# Patient Record
Sex: Female | Born: 1990 | Race: Black or African American | Hispanic: No | Marital: Single | Smoking: Never smoker
Health system: Southern US, Community
[De-identification: ages and names within clinical notes are randomized; demographics above are authoritative.]

## PROBLEM LIST (undated history)

## (undated) DIAGNOSIS — F410 Panic disorder [episodic paroxysmal anxiety] without agoraphobia: Secondary | ICD-10-CM

---
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
## 2013-01-06 ENCOUNTER — Encounter (HOSPITAL_COMMUNITY): Payer: Self-pay | Admitting: *Deleted

## 2013-01-06 ENCOUNTER — Emergency Department (HOSPITAL_COMMUNITY)
Admission: EM | Admit: 2013-01-06 | Discharge: 2013-01-06 | Disposition: A | Discharge status: Home / Self Care | Payer: BC Managed Care – PPO | Attending: Emergency Medicine | Admitting: Emergency Medicine

## 2013-01-06 DIAGNOSIS — F410 Panic disorder [episodic paroxysmal anxiety] without agoraphobia: Secondary | ICD-10-CM | POA: Insufficient documentation

## 2013-01-06 DIAGNOSIS — Z3202 Encounter for pregnancy test, result negative: Secondary | ICD-10-CM | POA: Insufficient documentation

## 2013-01-06 DIAGNOSIS — Z79899 Other long term (current) drug therapy: Secondary | ICD-10-CM | POA: Insufficient documentation

## 2013-01-06 HISTORY — DX: Panic disorder (episodic paroxysmal anxiety): F41.0

## 2013-01-06 LAB — CBC WITH DIFFERENTIAL/PLATELET
Basophils Absolute: 0 10*3/uL (ref 0.0–0.1)
Lymphocytes Relative: 19 % (ref 12–46)
Lymphs Abs: 1.7 10*3/uL (ref 0.7–4.0)
MCV: 89.3 fL (ref 78.0–100.0)
Neutro Abs: 6.7 10*3/uL (ref 1.7–7.7)
Platelets: 225 10*3/uL (ref 150–400)
RBC: 3.73 MIL/uL — ABNORMAL LOW (ref 3.87–5.11)
RDW: 12.3 % (ref 11.5–15.5)
WBC: 9 10*3/uL (ref 4.0–10.5)

## 2013-01-06 LAB — POCT PREGNANCY, URINE: Preg Test, Ur: NEGATIVE

## 2013-01-06 LAB — BASIC METABOLIC PANEL
CO2: 25 mEq/L (ref 19–32)
Calcium: 8.7 mg/dL (ref 8.4–10.5)
Chloride: 103 mEq/L (ref 96–112)
Creatinine, Ser: 0.79 mg/dL (ref 0.50–1.10)
GFR calc Af Amer: >90 mL/min (ref >90)
Sodium: 135 mEq/L (ref 135–145)

## 2013-01-06 LAB — URINE MICROSCOPIC-ADD ON

## 2013-01-06 LAB — URINALYSIS, ROUTINE W REFLEX MICROSCOPIC
Glucose, UA: NEGATIVE mg/dL
Nitrite: NEGATIVE
Specific Gravity, Urine: 1.029 (ref 1.005–1.030)
pH: 5.5 (ref 5.0–8.0)

## 2013-01-06 MED ORDER — ASPIRIN 81 MG PO CHEW: 162.0000 mg | CHEWABLE_TABLET | Freq: Once | ORAL | Status: DC

## 2013-01-06 MED ORDER — ALPRAZOLAM 0.5 MG PO TABS: 0.5000 mg | ORAL_TABLET | Freq: Two times a day (BID) | ORAL | Status: DC | PRN

## 2013-01-06 NOTE — ED Notes (Signed)
Pt comes in complaining of epigastric discomfort and nausea. sts that the pain made her anxious and she had a panic attack. Pt calm upon talking to her. Denies vomiting or diarrhea. Hx of anxiety

## 2013-01-06 NOTE — ED Notes (Addendum)
Pt states she is having a panic attack.  States she cannot breath and lightheaded, which is normal for panic attacks.  Sats of 100%.  Pt states she has been feeling "strange" lately.  Her stomach is "crampy".

## 2013-01-06 NOTE — ED Provider Notes (Signed)
History     CSN: [id number]  Arrival date & time 01/06/13  1257   First MD Initiated Contact with Patient 01/06/13 1411      Chief Complaint  Patient presents with  . Panic Attack    (Consider location/radiation/quality/duration/timing/severity/associated sxs/prior treatment) HPI Comments: Pt with no medical hx, no family hx of premature CAD comes in worth cc of panic attacks. States that she has been having some anxiety related problems for the past 2 years. She has not see a doctor for it. She states that without any provokation, about once a week, she will have anxiety  - where she becomes sick to her stomach, starts feeling like she has some dib. She has taken no meds. Today, she had an episode at work, worse than usual, and so she came to the ED. No hx of DVT, PE, no chest pain, no risk factors for acs, PE.   The history is provided by the patient.    Past Medical History  Diagnosis Date  . Panic attacks     History reviewed. No pertinent past surgical history.  No family history on file.  History  Substance Use Topics  . Smoking status: Never Smoker   . Smokeless tobacco: Not on file  . Alcohol Use: Yes     Comment: occasionally    OB History   Grav Para Term Preterm Abortions TAB SAB Ect Mult Living                  Review of Systems  Constitutional: Negative for activity change.  HENT: Negative for neck pain.   Respiratory: Negative for shortness of breath.   Cardiovascular: Negative for chest pain.  Gastrointestinal: Negative for nausea, vomiting and abdominal pain.  Genitourinary: Negative for dysuria.  Neurological: Negative for headaches.  Psychiatric/Behavioral: The patient is nervous/anxious.     Allergies  Review of patient's allergies indicates no known allergies.  Home Medications   Current Outpatient Rx  Name  Route  Sig  Dispense  Refill  . MELATONIN PO   Oral   Take 1 capsule by mouth at bedtime as needed (for sleep).         .  ALPRAZolam (XANAX) 0.5 MG tablet   Oral   Take 1 tablet (0.5 mg total) by mouth 2 (two) times daily as needed for anxiety.   10 tablet   0     BP 126/80  Pulse 84  Temp(Src) 98.8 F (37.1 C) (Oral)  Resp 14  SpO2 100%  LMP 12/06/2012  Physical Exam  Constitutional: She is oriented to person, place, and time. She appears well-developed and well-nourished.  HENT:  Head: Normocephalic and atraumatic.  Eyes: EOM are normal. Pupils are equal, round, and reactive to light.  Neck: Neck supple.  Cardiovascular: Normal rate, regular rhythm and normal heart sounds.   No murmur heard. Pulmonary/Chest: Effort normal. No respiratory distress.  Abdominal: Soft. She exhibits no distension. There is no tenderness. There is no rebound and no guarding.  Neurological: She is alert and oriented to person, place, and time.  Skin: Skin is warm and dry.    ED Course  Procedures (including critical care time)  Labs Reviewed  CBC WITH DIFFERENTIAL - Abnormal; Notable for the following:    RBC 3.73 (*)    Hemoglobin 11.5 (*)    HCT 33.3 (*)    All other components within normal limits  URINALYSIS, ROUTINE W REFLEX MICROSCOPIC - Abnormal; Notable for the following:  APPearance CLOUDY (*)    Hgb urine dipstick TRACE (*)    Leukocytes, UA SMALL (*)    All other components within normal limits  URINE MICROSCOPIC-ADD ON - Abnormal; Notable for the following:    Squamous Epithelial / LPF MANY (*)    Bacteria, UA MANY (*)    All other components within normal limits  BASIC METABOLIC PANEL  POCT PREGNANCY, URINE   No results found.   1. Panic anxiety syndrome       MDM   Date: 01/06/2013  Rate: 73  Rhythm: normal sinus rhythm  QRS Axis: normal  Intervals: normal  ST/T Wave abnormalities: normal  Conduction Disutrbances: none  Narrative Interpretation: unremarkable   Pt with hx of anxiety comes in with cc of panic attacks. She is feeling a lot better when i saw her. We will get  screenign EKG and basic labs only. She will get prn anxiety meds, i asked her to see a pcp and community resources were provided for that.      Derwood Kaplan, MD 01/06/13 1728

## 2014-01-06 ENCOUNTER — Emergency Department: Payer: Self-pay | Admitting: Internal Medicine

## 2015-05-28 ENCOUNTER — Emergency Department
Admission: EM | Admit: 2015-05-28 | Discharge: 2015-05-28 | Disposition: A | Discharge status: Home / Self Care | Payer: Self-pay | Attending: Emergency Medicine | Admitting: Emergency Medicine

## 2015-05-28 ENCOUNTER — Encounter: Payer: Self-pay | Admitting: *Deleted

## 2015-05-28 DIAGNOSIS — B9689 Other specified bacterial agents as the cause of diseases classified elsewhere: Secondary | ICD-10-CM

## 2015-05-28 DIAGNOSIS — Z3202 Encounter for pregnancy test, result negative: Secondary | ICD-10-CM | POA: Insufficient documentation

## 2015-05-28 DIAGNOSIS — N760 Acute vaginitis: Secondary | ICD-10-CM | POA: Insufficient documentation

## 2015-05-28 LAB — CHLAMYDIA/NGC RT PCR (ARMC ONLY)
Chlamydia Tr: NOT DETECTED
N gonorrhoeae: NOT DETECTED

## 2015-05-28 LAB — WET PREP, GENITAL
TRICH WET PREP: NONE SEEN
YEAST WET PREP: NONE SEEN

## 2015-05-28 LAB — POCT PREGNANCY, URINE: PREG TEST UR: NEGATIVE

## 2015-05-28 MED ORDER — METRONIDAZOLE 500 MG PO TABS: 500.0000 mg | ORAL_TABLET | Freq: Two times a day (BID) | ORAL | Status: DC

## 2015-05-28 NOTE — ED Notes (Signed)
Assessed per PA 

## 2015-05-28 NOTE — ED Provider Notes (Signed)
[facility] Emergency Department Provider Note ____________________________________________  Time seen: Approximately 1:19 PM  I have reviewed the triage vital signs and the nursing notes.   HISTORY  Chief Complaint Vaginal Bleeding   HPI Adrienne Trujillo is a 24 y.o. female who presents to the emergency Department for spotting after intercourse for the past several months. She denies elbow pain or abdominal pain. She denies pain with intercourse.   Past Medical History  Diagnosis Date  . Panic attacks     There are no active problems to display for this patient.   History reviewed. No pertinent past surgical history.  Current Outpatient Rx  Name  Route  Sig  Dispense  Refill  . ALPRAZolam (XANAX) 0.5 MG tablet   Oral   Take 1 tablet (0.5 mg total) by mouth 2 (two) times daily as needed for anxiety.   10 tablet   0   . MELATONIN PO   Oral   Take 1 capsule by mouth at bedtime as needed (for sleep).         . metroNIDAZOLE (FLAGYL) 500 MG tablet   Oral   Take 1 tablet (500 mg total) by mouth 2 (two) times daily. Avoid alcohol while taking this medication.   14 tablet   0     Allergies Review of patient's allergies indicates no known allergies.  No family history on file.  Social History Social History  Substance Use Topics  . Smoking status: Never Smoker   . Smokeless tobacco: None  . Alcohol Use: Yes     Comment: occasionally    Review of Systems Constitutional: No fever/chills Cardiovascular: Denies chest pain. Respiratory: Denies shortness of breath or cough. Gastrointestinal: Abdominal pain no., nausea no, vomitingno. Genitourinary: Dysuria no, vaginal discharge yes.. Musculoskeletal: Negative for back pain. Skin: Negative for rash. Neurological: Negative for headaches, focal weakness or numbness.  10-point ROS otherwise negative.  ____________________________________________   PHYSICAL EXAM:  VITAL SIGNS: ED  Triage Vitals  Enc Vitals Group     BP 05/28/15 1259 108/83 mmHg     Pulse Rate 05/28/15 1259 74     Resp 05/28/15 1259 18     Temp 05/28/15 1259 98.3 F (36.8 C)     Temp Source 05/28/15 1259 Oral     SpO2 05/28/15 1259 100 %     Weight 05/28/15 1259 133 lb (60.328 kg)     Height 05/28/15 1259  (1.753 m)     Head Cir --      Peak Flow --      Pain Score --      Pain Loc --      Pain Edu? --      Excl. in GC? --     Constitutional: Alert and oriented. Well appearing and in no acute distress. Eyes: Conjunctivae are normal. PERRL. EOMI. Head: Atraumatic. Nose: No congestion/rhinnorhea. Mouth/Throat: Mucous membranes are moist.  Oropharynx non-erythematous. Neck: No stridor. Cardiovascular: Good peripheral circulation. Respiratory: Normal respiratory effort.  No retractions. Gastrointestinal: Soft and nontender. No distention. No abdominal bruits. Genitourinary: Pelvic exam: Malodorous discharge present, external exam normal, then discharge noted in the vaginal vault and on the cervix. There is no cervical motion tenderness. There is no adnexal tenderness. Musculoskeletal: No extremity tenderness nor edema.  Neurologic:  Normal speech and language. No gross focal neurologic deficits are appreciated. Speech is normal. No gait instability. Skin:  Skin is warm, dry and intact. No rash noted. Psychiatric: Mood and affect are  normal. Speech and behavior are normal.  ____________________________________________   LABS (all labs ordered are listed, but only abnormal results are displayed)  Labs Reviewed  WET PREP, GENITAL - Abnormal; Notable for the following:    Clue Cells Wet Prep HPF POC FEW (*)    WBC, Wet Prep HPF POC MODERATE (*)    All other components within normal limits  CHLAMYDIA/NGC RT PCR ([facility] ONLY)  POC URINE PREG, ED  POCT PREGNANCY, URINE  POC URINE PREG, ED    ____________________________________________  RADIOLOGY   ____________________________________________   PROCEDURES  Procedure(s) performed: Pelvic exam see assessment  ____________________________________________   INITIAL IMPRESSION / ASSESSMENT AND PLAN / ED COURSE  Pertinent labs & imaging results that were available during my care of the patient were reviewed by me and considered in my medical decision making (see chart for details).  -----------------------------------------  Patient was advised to follow up with the primary care provider for symptoms that are not improving over the next week. She was advised to return to the emergency department for symptoms that change or worsen if unable to schedule an appointment with the primary care provider or specialist.  FINAL CLINICAL IMPRESSION(S) / ED DIAGNOSES  Final diagnoses:  Bacterial vaginal infection      Chinita Pester, FNP 05/28/15 1429  Loleta Rose, MD 05/28/15 1544

## 2015-05-28 NOTE — ED Notes (Signed)
Pt reports vaginal bleeding pink in color only after intercourse, pt denies abdominal pain

## 2015-05-28 NOTE — Discharge Instructions (Signed)
Bacterial Vaginosis Bacterial vaginosis is a vaginal infection that occurs when the normal balance of bacteria in the vagina is disrupted. It results from an overgrowth of certain bacteria. This is the most common vaginal infection in women of childbearing age. Treatment is important to prevent complications, especially in pregnant women, as it can cause a premature delivery. CAUSES  Bacterial vaginosis is caused by an increase in harmful bacteria that are normally present in smaller amounts in the vagina. Several different kinds of bacteria can cause bacterial vaginosis. However, the reason that the condition develops is not fully understood. RISK FACTORS Certain activities or behaviors can put you at an increased risk of developing bacterial vaginosis, including:  Having a new sex partner or multiple sex partners.  Douching.  Using an intrauterine device (IUD) for contraception. Women do not get bacterial vaginosis from toilet seats, bedding, swimming pools, or contact with objects around them. SIGNS AND SYMPTOMS  Some women with bacterial vaginosis have no signs or symptoms. Common symptoms include:  Grey vaginal discharge.  A fishlike odor with discharge, especially after sexual intercourse.  Itching or burning of the vagina and vulva.  Burning or pain with urination. DIAGNOSIS  Your health care provider will take a medical history and examine the vagina for signs of bacterial vaginosis. A sample of vaginal fluid may be taken. Your health care provider will look at this sample under a microscope to check for bacteria and abnormal cells. A vaginal pH test may also be done.  TREATMENT  Bacterial vaginosis may be treated with antibiotic medicines. These may be given in the form of a pill or a vaginal cream. A second round of antibiotics may be prescribed if the condition comes back after treatment.  HOME CARE INSTRUCTIONS   Only take over-the-counter or prescription medicines as  directed by your health care provider.  If antibiotic medicine was prescribed, take it as directed. Make sure you finish it even if you start to feel better.  Do not have sex until treatment is completed.  Tell all sexual partners that you have a vaginal infection. They should see their health care provider and be treated if they have problems, such as a mild rash or itching.  Practice safe sex by using condoms and only having one sex partner. SEEK MEDICAL CARE IF:   Your symptoms are not improving after 3 days of treatment.  You have increased discharge or pain.  You have a fever. MAKE SURE YOU:   Understand these instructions.  Will watch your condition.  Will get help right away if you are not doing well or get worse. FOR MORE INFORMATION  Centers for Disease Control and Prevention, Division of STD Prevention: www.cdc.gov/std American Sexual Health Association (ASHA): www.ashastd.org  Document Released: 09/29/2005 Document Revised: 07/20/2013 Document Reviewed: 05/11/2013 ExitCare Patient Information 2015 [employer]. This information is not intended to replace advice given to you by your health care provider. Make sure you discuss any questions you have with your health care provider.  

## 2015-06-11 ENCOUNTER — Telehealth: Payer: Self-pay | Admitting: Emergency Medicine

## 2015-06-11 NOTE — ED Notes (Signed)
Pt had called me about std tests.  i gave her results. She says she is feeling better.

## 2015-06-11 NOTE — ED Notes (Signed)
Patient left message on my voicemail asking for results from couple weeks ago.  Called her back today and left message.

## 2016-02-09 ENCOUNTER — Emergency Department
Admission: EM | Admit: 2016-02-09 | Discharge: 2016-02-09 | Disposition: A | Discharge status: Home / Self Care | Payer: BLUE CROSS/BLUE SHIELD | Attending: Emergency Medicine | Admitting: Emergency Medicine

## 2016-02-09 ENCOUNTER — Encounter: Payer: Self-pay | Admitting: Emergency Medicine

## 2016-02-09 ENCOUNTER — Emergency Department: Payer: BLUE CROSS/BLUE SHIELD

## 2016-02-09 DIAGNOSIS — R0789 Other chest pain: Secondary | ICD-10-CM | POA: Diagnosis present

## 2016-02-09 DIAGNOSIS — F410 Panic disorder [episodic paroxysmal anxiety] without agoraphobia: Secondary | ICD-10-CM | POA: Diagnosis not present

## 2016-02-09 DIAGNOSIS — R079 Chest pain, unspecified: Secondary | ICD-10-CM

## 2016-02-09 LAB — BASIC METABOLIC PANEL
ANION GAP: 9 (ref 5–15)
BUN: 15 mg/dL (ref 6–20)
CALCIUM: 9.1 mg/dL (ref 8.9–10.3)
CO2: 23 mmol/L (ref 22–32)
Chloride: 109 mmol/L (ref 101–111)
Creatinine, Ser: 1.03 mg/dL — ABNORMAL HIGH (ref 0.44–1.00)
GFR calc non Af Amer: >60 mL/min (ref >60)
GLUCOSE: 82 mg/dL (ref 65–99)
Potassium: 3.5 mmol/L (ref 3.5–5.1)
Sodium: 141 mmol/L (ref 135–145)

## 2016-02-09 LAB — CBC
HEMATOCRIT: 36.2 % (ref 35.0–47.0)
HEMOGLOBIN: 12.4 g/dL (ref 12.0–16.0)
MCH: 31.1 pg (ref 26.0–34.0)
MCHC: 34.3 g/dL (ref 32.0–36.0)
MCV: 90.9 fL (ref 80.0–100.0)
Platelets: 231 10*3/uL (ref 150–440)
RBC: 3.98 MIL/uL (ref 3.80–5.20)
RDW: 13.3 % (ref 11.5–14.5)
WBC: 6.7 10*3/uL (ref 3.6–11.0)

## 2016-02-09 LAB — TROPONIN I: Troponin I: <0.03 ng/mL (ref <0.031)

## 2016-02-09 NOTE — ED Notes (Addendum)
Pt states she has anxiety and has been "dealing with it for years now." Pt has been to [facility] a few times to see if seeing a therapist would help. Pt states she has anxiety often but chest pains began yesterday. 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 Pt states "I don't know, I think I just have a lot going on. It's just bad anxiety. It keeps me from sleeping sometimes." When asked what triggers the pt's anxiety, the pt responded: "the smallest things" for example pt said "I can just look at a person and go crazy." Denies SI or HI.

## 2016-02-09 NOTE — ED Provider Notes (Signed)
[facility] Emergency Department Provider Note  ____________________________________________  Time seen: Approximately 630AM  I have reviewed the triage vital signs and the nursing notes.   HISTORY  Chief Complaint Anxiety and Chest Pain    HPI Adrienne Trujillo is a 25 y.o. female with a history of panic attacks was presenting to the emergency Department with 2 days of worsening panic attacks associated with chest pain. She says that the chest pain is across her chest and feels like a pressure and cramping type pain. She denies any pain at this time. She says that she has been increasingly stressed about certain family issues. She is denying any homicidal or suicidal ideation. Does not take any medication for anxiety. Says that she has had chest pain in the past with her panic attacks. Denies any radiation of the pain. Denies any shortness of breath, nausea, vomiting or diaphoresis with the pain. Denies any hormone supplementation such as birth control. Denies any family members dying suddenly at young ages from cardiac causes.   Past Medical History  Diagnosis Date  . Panic attacks     There are no active problems to display for this patient.   History reviewed. No pertinent past surgical history.  No current outpatient prescriptions on file.  Allergies Amoxicillin  No family history on file.  Social History Social History  Substance Use Topics  . Smoking status: Never Smoker   . Smokeless tobacco: None  . Alcohol Use: Yes     Comment: occasionally    Review of Systems Constitutional: No fever/chills Eyes: No visual changes. ENT: No sore throat. Cardiovascular: As above Respiratory: Denies shortness of breath. Gastrointestinal: No abdominal pain.  No nausea, no vomiting.  No diarrhea.  No constipation. Genitourinary: Negative for dysuria. Musculoskeletal: Negative for back pain. Skin: Negative for rash. Neurological: Negative for focal  weakness or numbness.  10-point ROS otherwise negative.  ____________________________________________   PHYSICAL EXAM:  VITAL SIGNS: ED Triage Vitals  Enc Vitals Group     BP 02/09/16 0453 151/74 mmHg     Pulse Rate 02/09/16 0453 87     Resp 02/09/16 0453 18     Temp 02/09/16 0453 98.4 F (36.9 C)     Temp Source 02/09/16 0453 Oral     SpO2 02/09/16 0453 100 %     Weight 02/09/16 0453 125 lb (56.7 kg)     Height 02/09/16 0453 5\' 9"  (1.753 m)     Head Cir --      Peak Flow --      Pain Score 02/09/16 0454 2     Pain Loc --      Pain Edu? --      Excl. in GC? --     Constitutional: Alert and oriented. Well appearing and in no acute distress. Eyes: Conjunctivae are normal. PERRL. EOMI. Head: Atraumatic. Nose: No congestion/rhinnorhea. Mouth/Throat: Mucous membranes are moist.   Neck: No stridor.   Cardiovascular: Normal rate, regular rhythm. Grossly normal heart sounds.   Respiratory: Normal respiratory effort.  No retractions. Lungs CTAB. Gastrointestinal: Soft and nontender. No distention. No abdominal bruits.  Musculoskeletal: No lower extremity tenderness nor edema.  No joint effusions. Neurologic:  Normal speech and language. No gross focal neurologic deficits are appreciated. No gait instability. Skin:  Skin is warm, dry and intact. No rash noted. Psychiatric: Mood and affect are normal. Speech and behavior are normal.  ____________________________________________   LABS (all labs ordered are listed, but only abnormal results are  displayed)  Labs Reviewed  BASIC METABOLIC PANEL - Abnormal; Notable for the following:    Creatinine, Ser 1.03 (*)    All other components within normal limits  CBC  TROPONIN I   ____________________________________________  EKG  ED ECG REPORT I, Darah Simkin,  Teena Irani, the attending physician, personally viewed and interpreted this ECG.   Date: 02/09/2016  EKG Time: 503  Rate: 74  Rhythm: normal sinus rhythm with sinus  arrhythmia  Axis: Normal  Intervals:Early repolarization abnormality. RSR prime pattern in V1.  ST&T Change: No ST segment elevation or depression. No abnormal T-wave inversion. No significant change from EKG done on 01/06/2013. ____________________________________________  RADIOLOGY  No acute disease on the chest x-ray. ____________________________________________   PROCEDURES   ____________________________________________   INITIAL IMPRESSION / ASSESSMENT AND PLAN / ED COURSE  Pertinent labs & imaging results that were available during my care of the patient were reviewed by me and considered in my medical decision making (see chart for details).  Pt is PERC negative.  Patient says that the symptoms only are concurrent with her feeling stressed or panic. She says that she has been Rh able had a bad experience. We will consult TTS for psych referral and to ensure the patient has options for follow-up as an outpatient. I cleansed the patient and she is understanding of this plan. ____________________________________________   FINAL CLINICAL IMPRESSION(S) / ED DIAGNOSES  Panic attack. Chest pain.    Myrna Blazer, MD 02/09/16 [phone]

## 2016-02-09 NOTE — ED Notes (Signed)
Patient tearful in triage. Patient reports that she has issues with anxiety and has [facility]. Patient reports that the anxiety has been worse the last couple of days and started developing chest pain yesterday. Patient denies SI.

## 2016-02-09 NOTE — BH Assessment (Addendum)
Assessment Note  Adrienne Trujillo is an 25 y.o. female who presents to the ER due to anxiety and panic attacks. She states she has them approximately once a week. Symptoms are; chest pains, shortness of breath, increase heart palpations, dizziness and light headed.  Her primary concern was that she wasn't having any medical problems. She states, someone at her mother's job, who was the same age as the patient, had a heart attack. Patient doesn't have a history of heart problems but due to her anxiety and chest, she came to the ER to get checked out.  She's been seen in the past, with [facility] for anxiety. However, she didn't like the services they provided and stop going. She was prescribed medication for her anxiety but she didn't take it. "I don't even know what I did with them pills."  She denies SI/HI and AV/H.   Diagnosis: Anxiety  Past Medical History:  Past Medical History  Diagnosis Date  . Panic attacks     History reviewed. No pertinent past surgical history.  Family History: No family history on file.  Social History:  reports that she has never smoked. She does not have any smokeless tobacco history on file. She reports that she drinks alcohol. She reports that she does not use illicit drugs.  Additional Social History:  Alcohol / Drug Use Pain Medications: See PTA Prescriptions: See PTA Over the Counter: See PTA History of alcohol / drug use?: No history of alcohol / drug abuse Longest period of sobriety (when/how long): No use Negative Consequences of Use:  (No use) Withdrawal Symptoms:  (No use)  CIWA: CIWA-Ar BP: (!) 151/74 mmHg Pulse Rate: 87 COWS:    Allergies:  Allergies  Allergen Reactions  . Amoxicillin Hives and Other (See Comments)    Has patient had a PCN reaction causing immediate rash, facial/tongue/throat swelling, SOB or lightheadedness with hypotension: No Has patient had a PCN reaction causing severe rash involving mucus membranes or skin necrosis:  No Has patient had a PCN reaction that required hospitalization No Has patient had a PCN reaction occurring within the last 10 years: Yes If all of the above answers are "NO", then may proceed with Cephalosporin use.    Home Medications:  (Not in a hospital admission)  OB/GYN Status:  Patient's last menstrual period was 01/12/2016 (approximate).  General Assessment Data [facility] Assessment: In system Is this a Tele or Face-to-Face Assessment?: Face-to-Face Is this an Initial Assessment or a Re-assessment for this encounter?: Initial Assessment Marital status: Single Maiden name: n/a Is patient pregnant?: No Pregnancy Status: No Living Arrangements: Parent Can pt return to current living arrangement?: Yes Admission Status: Voluntary Is patient capable of signing voluntary admission?: Yes Referral Source: Self/Family/Friend Insurance type: Scientist, research (physical sciences)BCBS  Medical Screening Exam [facility] Walk-[facility] ONLY) Medical Exam completed: Yes Reason for MSE not completed:  (n/a)  Crisis Care Plan Living Arrangements: Parent Legal Guardian: Other: Name of Psychiatrist: Reports of none Name of Therapist: Reports of none  Education Status Is patient currently in school?: No Current Grade: n/a Highest grade of school patient has completed: College Name of school: n/a Contact person: n/a  Risk to self with the past 6 months Suicidal Ideation: No Has patient been a risk to self within the past 6 months prior to admission? : Other (comment) Suicidal Intent: No Has patient had any suicidal intent within the past 6 months prior to admission? : No Is patient at risk for suicide?: No Suicidal  Plan?: No Has patient had any suicidal plan within the past 6 months prior to admission? : No Access to Means: No What has been your use of drugs/alcohol within the last 12 months?: Reports of none Previous Attempts/Gestures: No Other Self Harm Risks: Reports of none Triggers for Past  Attempts: None known Intentional Self Injurious Behavior: None Family Suicide History: No Recent stressful life event(s): Other (Comment) Persecutory voices/beliefs?: No Depression: No Depression Symptoms: Feeling angry/irritable, Isolating Substance abuse history and/or treatment for substance abuse?: No Suicide prevention information given to non-admitted patients: Not applicable  Risk to Others within the past 6 months Homicidal Ideation: No Does patient have any lifetime risk of violence toward others beyond the six months prior to admission? : No Thoughts of Harm to Others: No Current Homicidal Intent: No Current Homicidal Plan: No Access to Homicidal Means: No Identified Victim: Reports of none History of harm to others?: No Assessment of Violence: None Noted Violent Behavior Description: Reports of none Does patient have access to weapons?: No Criminal Charges Pending?: No Does patient have a court date: No Is patient on probation?: No  Psychosis Hallucinations: None noted Delusions: None noted  Mental Status Report Appearance/Hygiene: In hospital gown, In scrubs, Unremarkable Eye Contact: Good Motor Activity: Freedom of movement, Unremarkable Speech: Logical/coherent, Unremarkable Level of Consciousness: Alert Mood: Anxious, Pleasant Affect: Appropriate to circumstance Anxiety Level: Minimal Thought Processes: Coherent, Relevant Judgement: Unimpaired Orientation: Person, Place, Time, Situation, Appropriate for developmental age Obsessive Compulsive Thoughts/Behaviors: Minimal  Cognitive Functioning Concentration: Normal Memory: Recent Intact, Remote Intact IQ: Average Insight: Fair Impulse Control: Fair Appetite: Fair Weight Loss: 0 Weight Gain: 0 Sleep: No Change Total Hours of Sleep: 6 Vegetative Symptoms: None  ADLScreening [facility] Assessment Services) Patient's cognitive ability adequate to safely complete daily activities?: Yes Patient able to  express need for assistance with ADLs?: Yes Independently performs ADLs?: Yes (appropriate for developmental age)  Prior Inpatient Therapy Prior Inpatient Therapy: No Prior Therapy Dates: n/a Prior Therapy Facilty/Provider(s): n/a Reason for Treatment: n/a  Prior Outpatient Therapy Prior Outpatient Therapy: Yes Prior Therapy Dates: Unknown Prior Therapy Facilty/Provider(s): [facility] Reason for Treatment: Anxiety Does patient have an ACCT team?: No Does patient have [facility] services? : No Does patient have [facility] services?: No  ADL Screening (condition at time of admission) Patient's cognitive ability adequate to safely complete daily activities?: Yes Is the patient deaf or have difficulty hearing?: No Does the patient have difficulty seeing, even when wearing glasses/contacts?: No Does the patient have difficulty concentrating, remembering, or making decisions?: No Patient able to express need for assistance with ADLs?: Yes Does the patient have difficulty dressing or bathing?: No Independently performs ADLs?: Yes (appropriate for developmental age) Does the patient have difficulty walking or climbing stairs?: No Weakness of Legs: None Weakness of Arms/Hands: None  Home Assistive Devices/Equipment Home Assistive Devices/Equipment: None  Therapy Consults (therapy consults require a physician order) PT Evaluation Needed: No OT Evalulation Needed: No SLP Evaluation Needed: No Abuse/Neglect Assessment (Assessment to be complete while patient is alone) Physical Abuse: Denies Verbal Abuse: Denies Sexual Abuse: Denies Exploitation of patient/patient's resources: Denies Self-Neglect: Denies Values / Beliefs Cultural Requests During Hospitalization: None Spiritual Requests During Hospitalization: None Consults Spiritual Care Consult Needed: No Social Work Consult Needed: No Merchant navy officer (For Healthcare) Does patient have an advance directive?: No    Additional  Information 1:1 In Past 12 Months?: No CIRT Risk: No Elopement Risk: No Does patient have medical clearance?: Yes  Child/Adolescent Assessment 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

## 2016-02-09 NOTE — Discharge Instructions (Signed)
You were evaluated for chest discomfort, and although no certain cause was found, Dr. Langston MaskerShaevitz suspected some anxiety component.  You examine evaluation are reassuring today in the emergency department. Return to emergency department for any worsening condition clean trouble breathing, chest pain, fever, coughing, dizziness or passing out, or any palpitations. Return for any thoughts of wanting to hurt herself or others, or depression.   Nonspecific Chest Pain It is often hard to find the cause of chest pain. There is always a chance that your pain could be related to something serious, such as a heart attack or a blood clot in your lungs. Chest pain can also be caused by conditions that are not life-threatening. If you have chest pain, it is very important to follow up with your doctor.  HOME CARE  If you were prescribed an antibiotic medicine, finish it all even if you start to feel better.  Avoid any activities that cause chest pain.  Do not use any tobacco products, including cigarettes, chewing tobacco, or electronic cigarettes. If you need help quitting, ask your doctor.  Do not drink alcohol.  Take medicines only as told by your doctor.  Keep all follow-up visits as told by your doctor. This is important. This includes any further testing if your chest pain does not go away.  Your doctor may tell you to keep your head raised (elevated) while you sleep.  Make lifestyle changes as told by your doctor. These may include:  Getting regular exercise. Ask your doctor to suggest some activities that are safe for you.  Eating a heart-healthy diet. Your doctor or a diet specialist (dietitian) can help you to learn healthy eating options.  Maintaining a healthy weight.  Managing diabetes, if necessary.  Reducing stress. GET HELP IF:  Your chest pain does not go away, even after treatment.  You have a rash with blisters on your chest.  You have a fever. GET HELP RIGHT AWAY  IF:  Your chest pain is worse.  You have an increasing cough, or you cough up blood.  You have severe belly (abdominal) pain.  You feel extremely weak.  You pass out (faint).  You have chills.  You have sudden, unexplained chest discomfort.  You have sudden, unexplained discomfort in your arms, back, neck, or jaw.  You have shortness of breath at any time.  You suddenly start to sweat, or your skin gets clammy.  You feel nauseous.  You vomit.  You suddenly feel light-headed or dizzy.  Your heart begins to beat quickly, or it feels like it is skipping beats. These symptoms may be an emergency. Do not wait to see if the symptoms will go away. Get medical help right away. Call your local emergency services (911 in the [place] Do not drive yourself to the hospital.   This information is not intended to replace advice given to you by your health care provider. Make sure you discuss any questions you have with your health care provider.   Document Released: 03/17/2008 Document Revised: 10/20/2014 Document Reviewed: 05/05/2014 Elsevier Interactive Patient Education 2016 Elsevier Inc.  Panic Attacks Panic attacks are sudden, short-livedsurges of severe anxiety, fear, or discomfort. They may occur for no reason when you are relaxed, when you are anxious, or when you are sleeping. Panic attacks may occur for a number of reasons:   Healthy people occasionally have panic attacks in extreme, life-threatening situations, such as war or natural disasters. Normal anxiety is a protective mechanism of the body that  helps [place] react to danger (fight or flight response).  Panic attacks are often seen with anxiety disorders, such as panic disorder, social anxiety disorder, generalized anxiety disorder, and phobias. Anxiety disorders cause excessive or uncontrollable anxiety. They may interfere with your relationships or other life activities.  Panic attacks are sometimes seen with other mental  illnesses, such as depression and posttraumatic stress disorder.  Certain medical conditions, prescription medicines, and drugs of abuse can cause panic attacks. SYMPTOMS  Panic attacks start suddenly, peak within 20 minutes, and are accompanied by four or more of the following symptoms:  Pounding heart or fast heart rate (palpitations).  Sweating.  Trembling or shaking.  Shortness of breath or feeling smothered.  Feeling choked.  Chest pain or discomfort.  Nausea or strange feeling in your stomach.  Dizziness, light-headedness, or feeling like you will faint.  Chills or hot flushes.  Numbness or tingling in your lips or hands and feet.  Feeling that things are not real or feeling that you are not yourself.  Fear of losing control or going crazy.  Fear of dying. Some of these symptoms can mimic serious medical conditions. For example, you may think you are having a heart attack. Although panic attacks can be very scary, they are not life threatening. DIAGNOSIS  Panic attacks are diagnosed through an assessment by your health care provider. Your health care provider will ask questions about your symptoms, such as where and when they occurred. Your health care provider will also ask about your medical history and use of alcohol and drugs, including prescription medicines. Your health care provider may order blood tests or other studies to rule out a serious medical condition. Your health care provider may refer you to a mental health professional for further evaluation. TREATMENT   Most healthy people who have one or two panic attacks in an extreme, life-threatening situation will not require treatment.  The treatment for panic attacks associated with anxiety disorders or other mental illness typically involves counseling with a mental health professional, medicine, or a combination of both. Your health care provider will help determine what treatment is best for you.  Panic  attacks due to physical illness usually go away with treatment of the illness. If prescription medicine is causing panic attacks, talk with your health care provider about stopping the medicine, decreasing the dose, or substituting another medicine.  Panic attacks due to alcohol or drug abuse go away with abstinence. Some adults need professional help in order to stop drinking or using drugs. HOME CARE INSTRUCTIONS   Take all medicines as directed by your health care provider.   Schedule and attend follow-up visits as directed by your health care provider. It is important to keep all your appointments. SEEK MEDICAL CARE IF:  You are not able to take your medicines as prescribed.  Your symptoms do not improve or get worse. SEEK IMMEDIATE MEDICAL CARE IF:   You experience panic attack symptoms that are different than your usual symptoms.  You have serious thoughts about hurting yourself or others.  You are taking medicine for panic attacks and have a serious side effect. MAKE SURE YOU:  Understand these instructions.  Will watch your condition.  Will get help right away if you are not doing well or get worse.   This information is not intended to replace advice given to you by your health care provider. Make sure you discuss any questions you have with your health care provider.   Document Released: 09/29/2005  Document Revised: 10/04/2013 Document Reviewed: 05/13/2013 Elsevier Interactive Patient Education [employer]

## 2016-02-09 NOTE — ED Notes (Signed)
Dr. Schaevitz at bedside.  

## 2016-02-09 NOTE — BH Assessment (Signed)
Discussed patient with ER MD (Dr. Shaune PollackLord) and patient is able to discharge home when medically cleared. Patient was giving referral information and instructions on how to follow up with Outpatient Treatment ([facility] and [facility]) and [facility].  Writer also advised the patient to call the toll free phone on his insurance card for the counselors and agencies in his network.  Patient denies SI/HI and AV/H.

## 2016-02-09 NOTE — ED Provider Notes (Signed)
I accepted care from Dr. Langston MaskerShaevitz.  She came here for some nonspecific chest discomfort, and her evaluation was reassuring, not suggestive of emergency cardiac or pulmonary problem. I will refer her for primary care follow-up.  There is some concern for anxiety/panic attack as a contributor to the episode.  Calvin, from [facility], did evaluate the patient, and she would like outpatient referrals for behavioral medicine.    Governor Rooksebecca Laylaa Guevarra, MD 02/09/16 [phone]

## 2016-11-03 IMAGING — CR DG CHEST 2V
1 series · 2 of 2 positions shown · non-contrast
Comparison: None.

CLINICAL DATA: Chest pain

EXAM:
CHEST  2 VIEW

[Series 1: w chest pa · 0.14mm/px · 2 of 2 slices shown]
[im 1/2]
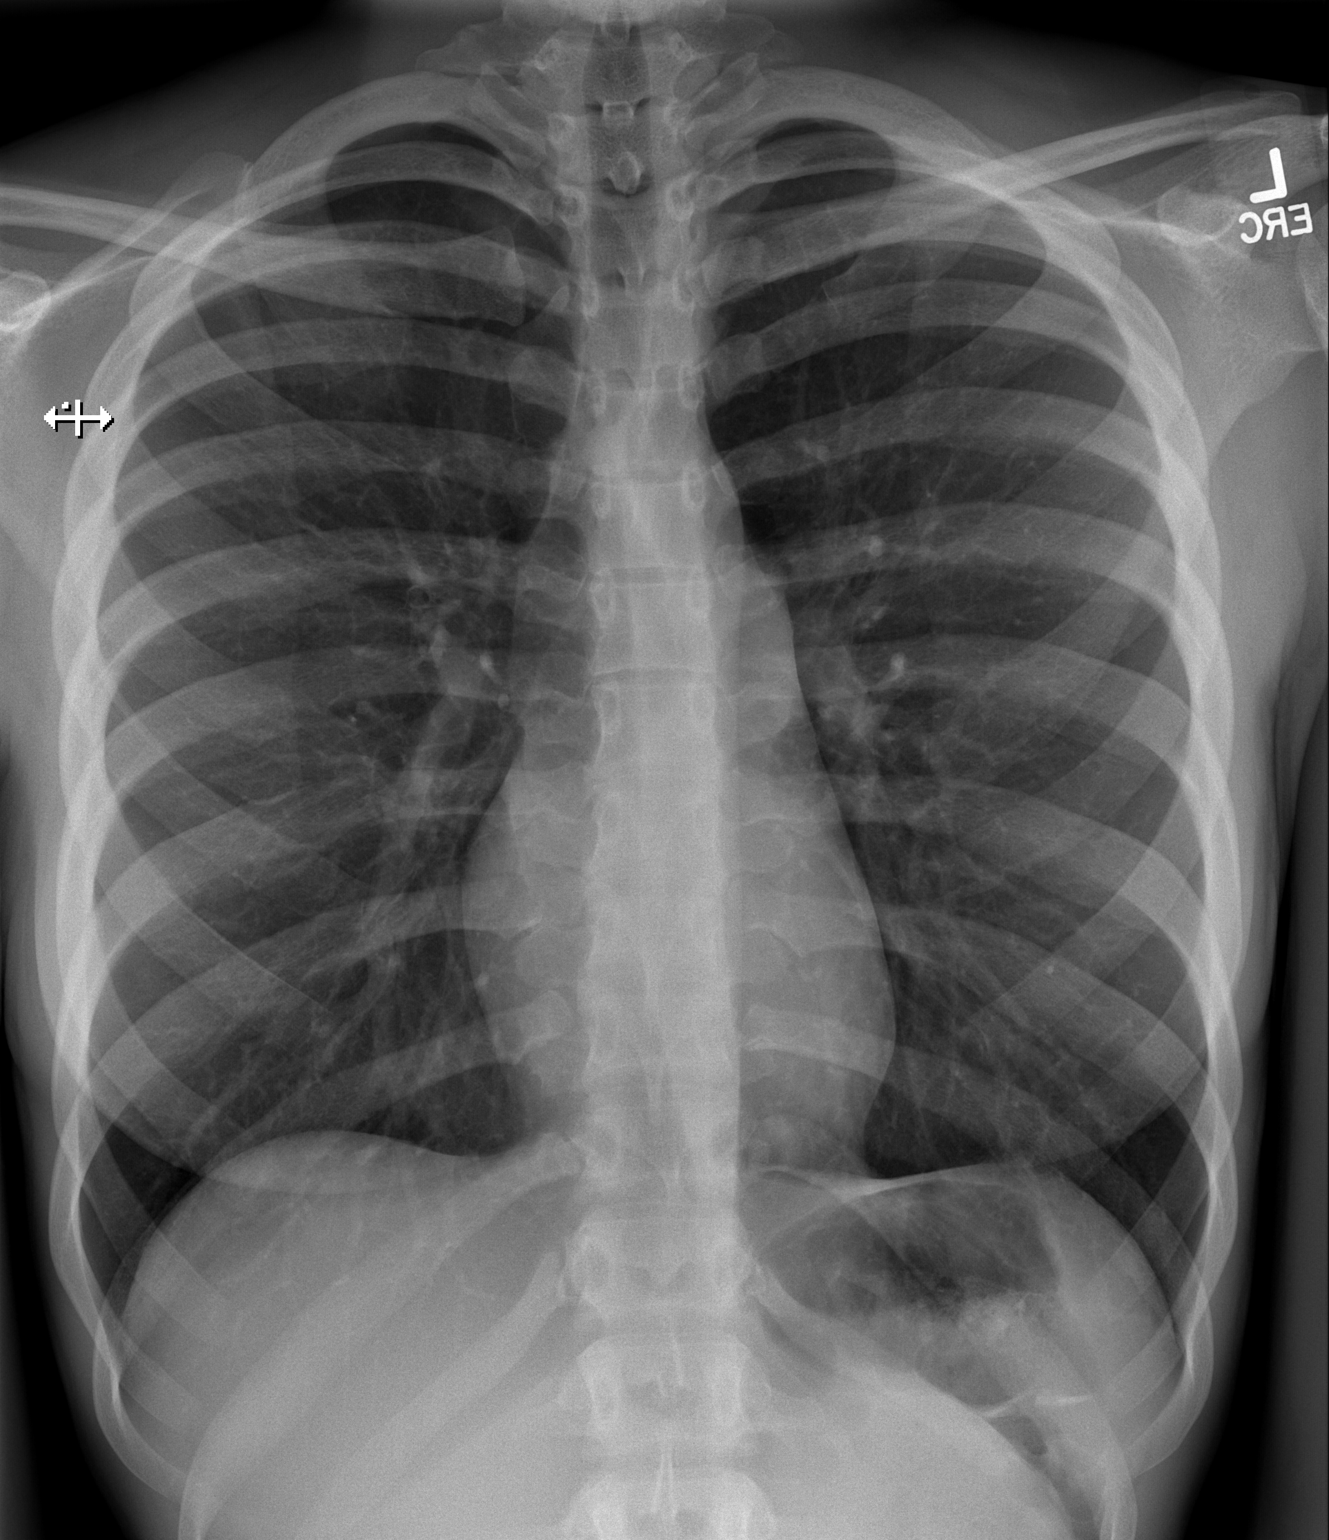
[im 2/2]
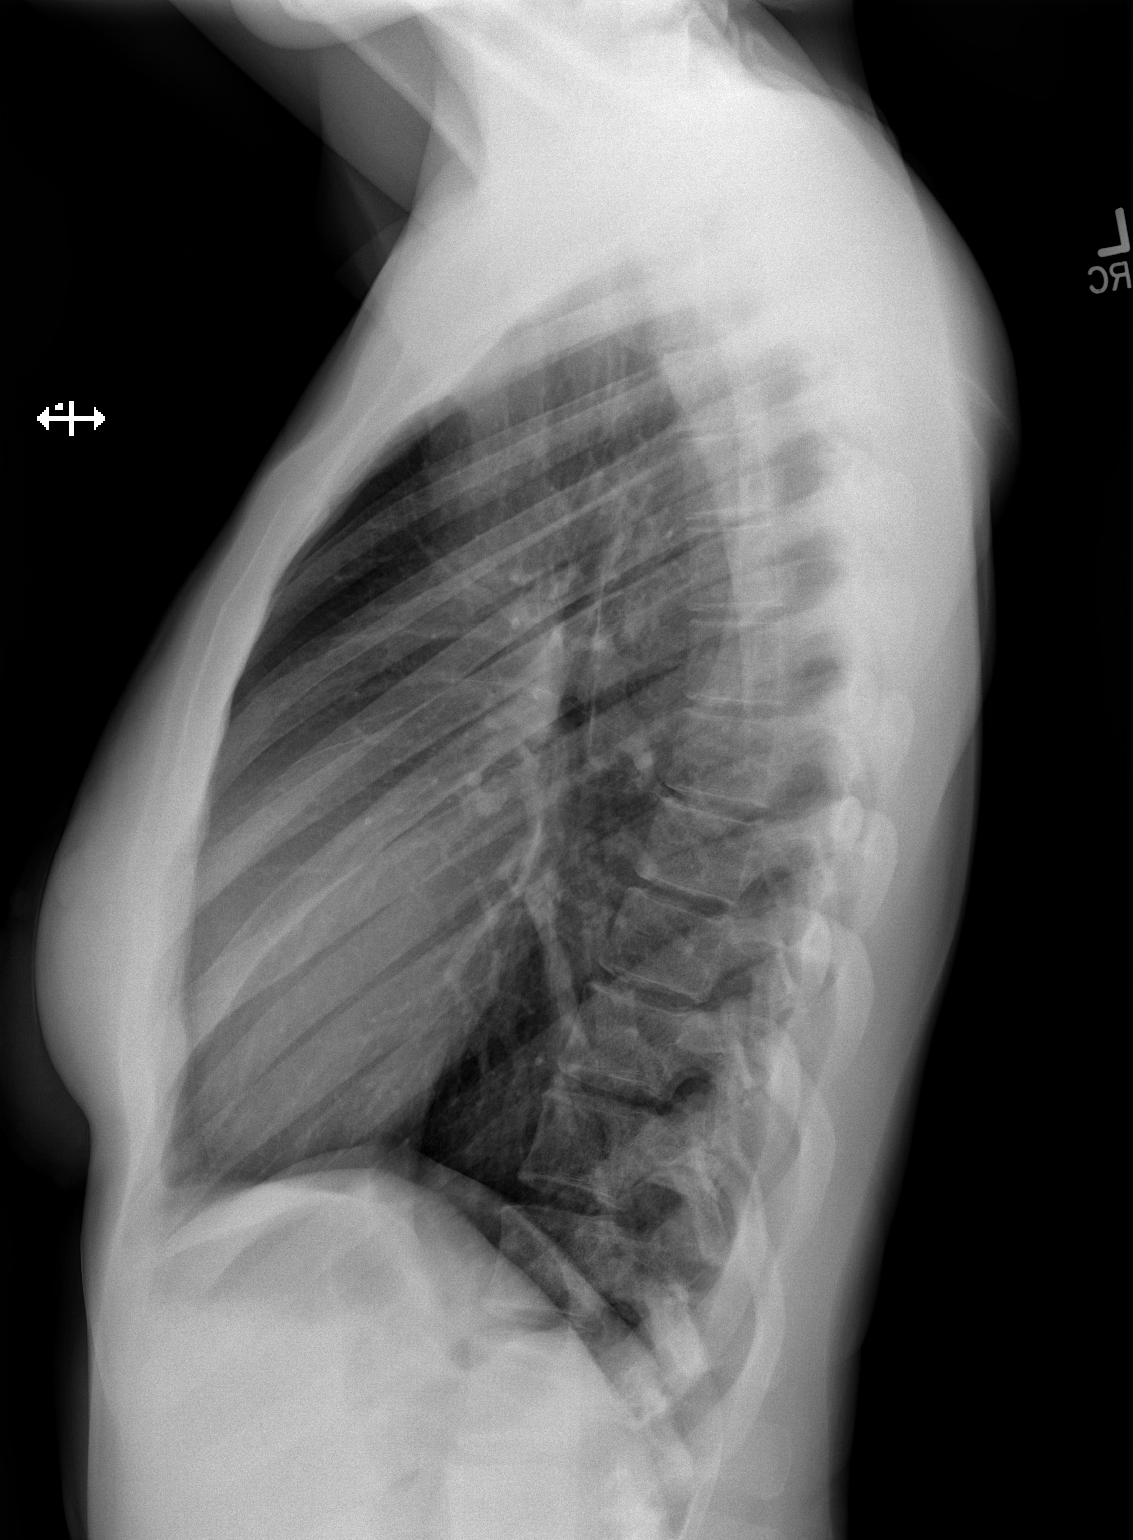

[2 of 2 positions shown; findings below may reference images not displayed]

FINDINGS: Normal heart size and mediastinal contours. No acute infiltrate or
edema. No effusion or pneumothorax. No osseous findings.
IMPRESSION: Normal chest.

## 2017-10-21 ENCOUNTER — Emergency Department
Admission: EM | Admit: 2017-10-21 | Discharge: 2017-10-21 | Disposition: A | Discharge status: Home / Self Care | Payer: BLUE CROSS/BLUE SHIELD | Attending: Emergency Medicine | Admitting: Emergency Medicine

## 2017-10-21 ENCOUNTER — Other Ambulatory Visit: Payer: Self-pay

## 2017-10-21 DIAGNOSIS — F419 Anxiety disorder, unspecified: Secondary | ICD-10-CM | POA: Diagnosis present

## 2017-10-21 DIAGNOSIS — Z880 Allergy status to penicillin: Secondary | ICD-10-CM | POA: Diagnosis not present

## 2017-10-21 LAB — COMPREHENSIVE METABOLIC PANEL
ALK PHOS: 43 U/L (ref 38–126)
ALT: 20 U/L (ref 14–54)
AST: 34 U/L (ref 15–41)
Albumin: 4.9 g/dL (ref 3.5–5.0)
Anion gap: 14 (ref 5–15)
BILIRUBIN TOTAL: 1.4 mg/dL — AB (ref 0.3–1.2)
BUN: 14 mg/dL (ref 6–20)
CALCIUM: 9.4 mg/dL (ref 8.9–10.3)
CO2: 19 mmol/L — AB (ref 22–32)
CREATININE: 0.84 mg/dL (ref 0.44–1.00)
Chloride: 102 mmol/L (ref 101–111)
GFR calc Af Amer: >60 mL/min (ref >60)
GFR calc non Af Amer: >60 mL/min (ref >60)
GLUCOSE: 80 mg/dL (ref 65–99)
Potassium: 3.5 mmol/L (ref 3.5–5.1)
SODIUM: 135 mmol/L (ref 135–145)
TOTAL PROTEIN: 8 g/dL (ref 6.5–8.1)

## 2017-10-21 LAB — CBC
HEMATOCRIT: 37.6 % (ref 35.0–47.0)
HEMOGLOBIN: 12.7 g/dL (ref 12.0–16.0)
MCH: 31 pg (ref 26.0–34.0)
MCHC: 33.7 g/dL (ref 32.0–36.0)
MCV: 92 fL (ref 80.0–100.0)
Platelets: 247 10*3/uL (ref 150–440)
RBC: 4.09 MIL/uL (ref 3.80–5.20)
RDW: 13.7 % (ref 11.5–14.5)
WBC: 8.7 10*3/uL (ref 3.6–11.0)

## 2017-10-21 LAB — ACETAMINOPHEN LEVEL: Acetaminophen (Tylenol), Serum: <10 ug/mL — ABNORMAL LOW (ref 10–30)

## 2017-10-21 LAB — SALICYLATE LEVEL: Salicylate Lvl: <7 mg/dL (ref 2.8–30.0)

## 2017-10-21 LAB — ETHANOL: Alcohol, Ethyl (B): <10 mg/dL (ref <10)

## 2017-10-21 NOTE — ED Provider Notes (Signed)
[facility] Emergency Department Provider Note  ____________________________________________   First MD Initiated Contact with Patient 10/21/17 0255     (approximate)  I have reviewed the triage vital signs and the nursing notes.   HISTORY  Chief Complaint Behavior Problem    HPI Adrienne Trujillo is a 27 y.o. female with a history of panic attacks/anxiety, marijuana use, and possibly other unspecified psychiatric issues who presents for evaluation of anxiety and feeling like she cannot stay by herself right now.  She is visibly anxious but otherwise appropriate.  She states that she has been stressed out by a lot that has been going on in the world and in her life recently.  She has difficulty trusting people.  She has been upset by a TV program that she was watching recently.  She felt like she could not be alone tonight, not because she was having any thoughts of hurting herself, but simply because she thought she would be comforted by having others around her.  She stated "I knew you guys were here all night so I thought it would be a good place to come."  However after waiting about 40 minutes to be seen she was getting increasingly anxious and was planning to leave so I saw her as soon as possible before she left.  She adamantly denies any suicidal ideation or homicidal patient.  She states that she still feels anxious but would rather be home, does not heal she is in any danger, has no plans to harm herself, and feels this is just the result of all the issues going on her life right now.  She denies any acute medical issues as documented in the systems below.  Past Medical History:  Diagnosis Date  . Panic attacks     There are no active problems to display for this patient.   No past surgical history on file.  Prior to Admission medications   Not on File    Allergies Amoxicillin  No family history on file.  Social History Social History    Tobacco Use  . Smoking status: Never Smoker  Substance Use Topics  . Alcohol use: Yes    Comment: occasionally  . Drug use: No    Review of Systems Constitutional: No fever/chills Eyes: No visual changes. ENT: No sore throat. Cardiovascular: Denies chest pain. Respiratory: Denies shortness of breath. Gastrointestinal: No abdominal pain.  No nausea, no vomiting.  No diarrhea.  No constipation. Genitourinary: Negative for dysuria. Musculoskeletal: Negative for neck pain.  Negative for back pain. Integumentary: Negative for rash. Neurological: Negative for headaches, focal weakness or numbness. Psychiatric:Anxious, but no SI/HI  ____________________________________________   PHYSICAL EXAM:  VITAL SIGNS: ED Triage Vitals  Enc Vitals Group     BP 10/21/17 0226 126/66     Pulse Rate 10/21/17 0226 95     Resp 10/21/17 0226 20     Temp 10/21/17 0226 98.7 F (37.1 C)     Temp Source 10/21/17 0226 Oral     SpO2 10/21/17 0226 99 %     Weight 10/21/17 0227 63.5 kg (140 lb)     Height 10/21/17 0227 1.753 m (5\' 9" )     Head Circumference --      Peak Flow --      Pain Score --      Pain Loc --      Pain Edu? --      Excl. in GC? --     Constitutional:  Alert and oriented. Well appearing and in no acute distress. Eyes: Conjunctivae are normal.  Head: Atraumatic. Nose: No congestion/rhinnorhea. Mouth/Throat: Mucous membranes are moist. Neck: No stridor.  No meningeal signs.   Cardiovascular: Normal rate, regular rhythm. Good peripheral circulation. Grossly normal heart sounds. Respiratory: Normal respiratory effort.  No retractions. Lungs CTAB. Gastrointestinal: Soft and nontender. No distention.  Musculoskeletal: No lower extremity tenderness nor edema. No gross deformities of extremities. Neurologic:  Normal speech and language. No gross focal neurologic deficits are appreciated.  Skin:  Skin is warm, dry and intact. No rash noted. Psychiatric: Mood and affect are  somewhat anxious and agitated, but she is coherent, generally appropriate, denies SI/HI, and has good insight and judgment.  ____________________________________________   LABS (all labs ordered are listed, but only abnormal results are displayed)  Labs Reviewed  COMPREHENSIVE METABOLIC PANEL - Abnormal; Notable for the following components:      Result Value   CO2 19 (*)    Total Bilirubin 1.4 (*)    All other components within normal limits  ACETAMINOPHEN LEVEL - Abnormal; Notable for the following components:   Acetaminophen (Tylenol), Serum <10 (*)    All other components within normal limits  ETHANOL  SALICYLATE LEVEL  CBC   ____________________________________________  EKG  None - EKG not ordered by ED physician ____________________________________________  RADIOLOGY   No results found.  ____________________________________________   PROCEDURES  Critical Care performed: No   Procedure(s) performed:   Procedures   ____________________________________________   INITIAL IMPRESSION / ASSESSMENT AND PLAN / ED COURSE  As part of my medical decision making, I reviewed the following data within the electronic MEDICAL RECORD NUMBER Nursing notes reviewed and incorporated, Old chart reviewed and Notes from prior ED visits    Differential diagnosis includes, but is not limited to, anxiety/panic attacks, schizophrenia or schizoaffective disorder, mania/manic depression, drug use, etc.  The patient does admit to some marijuana use but does not think this is the problem.  I talked your at some length and in my opinion she has the capacity to make her own decisions and does not meet criteria for involuntary commitment nor inpatient treatment.  I do not believe she represents a danger to herself and I feel that putting her under involuntary commitment would escalate the situation substantially, would actually be a violation of her civil rights given that she does have capacity  to make her own decisions, and would be more likely to cause harm than good.  I encouraged her to stay and offered to provide some medicine to help calm her down and help her sleep, but she declines in favor of going home, and after offering twice and her declining twice I feel that she has made her decision and I will not attempt to continue to convince her to stay.  She knows to come back if she gets worse and I gave my usual and customary return precautions.     ____________________________________________  FINAL CLINICAL IMPRESSION(S) / ED DIAGNOSES  Final diagnoses:  Anxiety     MEDICATIONS GIVEN DURING THIS VISIT:  Medications - No data to display   ED Discharge Orders    None       Note:  This document was prepared using Dragon voice recognition software and may include unintentional dictation errors.    Loleta Rose, MD 10/21/17 [phone]

## 2017-10-21 NOTE — ED Notes (Signed)
Pt spoke at length with EDP. Plan to discharge home and follow up with [facility] services. Pt without thoughts of SI/HI. Understands discharge instructions.

## 2017-10-21 NOTE — Discharge Instructions (Signed)
You have been seen in the Emergency Department (ED) today for anxiety and possible panic attacks.  You chose not to stay in the ED for further evaluation, and since you are not having any thoughts of hurting yourself or others, we are discharging you with some recommendations for outpatient follow up.  Please follow up with the recommended doctor as instructed above in these documents regarding today?s emergent visit and your recent symptoms to discuss further management.    Return to the emergency department if you develop new or worsening symptoms that concern you, including thoughts of hurting or killing yourself or others.

## 2017-10-21 NOTE — ED Triage Notes (Signed)
Pt ambulatory to triage.  Pt crying in triage and states she can't stay by herself right now.  pt denies SI or HI.   Pt states she has smoked marijuana today.  Pt cooperative.

## 2017-10-21 NOTE — ED Notes (Addendum)
Pt refuses to give up her cell phone for [facility] med eval..  Charge nurse raquel rn aware.  Pt taken to 20h.

## 2018-05-11 ENCOUNTER — Emergency Department
Admission: EM | Admit: 2018-05-11 | Discharge: 2018-05-11 | Disposition: A | Discharge status: Home / Self Care | Payer: BLUE CROSS/BLUE SHIELD | Attending: Emergency Medicine | Admitting: Emergency Medicine

## 2018-05-11 ENCOUNTER — Other Ambulatory Visit: Payer: Self-pay

## 2018-05-11 ENCOUNTER — Encounter: Payer: Self-pay | Admitting: Emergency Medicine

## 2018-05-11 DIAGNOSIS — R5383 Other fatigue: Secondary | ICD-10-CM | POA: Insufficient documentation

## 2018-05-11 DIAGNOSIS — R110 Nausea: Secondary | ICD-10-CM | POA: Insufficient documentation

## 2018-05-11 DIAGNOSIS — R42 Dizziness and giddiness: Secondary | ICD-10-CM | POA: Insufficient documentation

## 2018-05-11 NOTE — Discharge Instructions (Addendum)
Advised to follow-up with PCP for definitive evaluation and treatment.

## 2018-05-11 NOTE — ED Notes (Signed)
Pt states she needs a not for work due to dizziness but is refusing examination. Pt seen by provider and given a list of healthcare providers and options for [place] to follow up regarding dizziness.

## 2018-05-11 NOTE — ED Provider Notes (Signed)
[facility] Emergency Department Provider Note   ____________________________________________   First MD Initiated Contact with Patient 05/11/18 1724     (approximate)  I have reviewed the triage vital signs and the nursing notes.   HISTORY  Chief Complaint Work Note    HPI HAYDEE JABBOUR is a 27 y.o. female patient complain of fatigue, dizziness, and intermittent nausea.  Patient state she does not want to be evaluated for these complaints.  Patient states requests work excuse for today and tomorrow.  Patient states she has insurance and will seek a family doctor instead utilize in the emergency room for evaluation or testing.  Past Medical History:  Diagnosis Date  . Panic attacks     There are no active problems to display for this patient.   History reviewed. No pertinent surgical history.  Prior to Admission medications   Not on File    Allergies Amoxicillin  No family history on file.  Social History Social History   Tobacco Use  . Smoking status: Never Smoker  . Smokeless tobacco: Never Used  Substance Use Topics  . Alcohol use: Yes    Comment: occasionally  . Drug use: No    Review of Systems  Constitutional: No fever/chills.  Fatigue and vertigo. Eyes: No visual changes. ENT: No sore throat. Cardiovascular: Denies chest pain. Respiratory: Denies shortness of breath. Gastrointestinal: No abdominal pain.  No nausea, no vomiting.  No diarrhea.  No constipation. Genitourinary: Negative for dysuria. Musculoskeletal: Negative for back pain. Skin: Negative for rash. Neurological: Negative for headaches, focal weakness or numbness. Psychiatric:Anxiety ____________________________________________   PHYSICAL EXAM:  VITAL SIGNS: ED Triage Vitals  Enc Vitals Group     BP 05/11/18 1702 105/84     Pulse Rate 05/11/18 1702 (!) 104     Resp --      Temp 05/11/18 1702 98.7 F (37.1 C)     Temp Source 05/11/18 1702 Oral      SpO2 05/11/18 1702 97 %     Weight 05/11/18 1703 135 lb (61.2 kg)     Height 05/11/18 1703 5\' 9"  (1.753 m)     Head Circumference --      Peak Flow --      Pain Score 05/11/18 1718 0     Pain Loc --      Pain Edu? --      Excl. in GC? --    Patient refused exam.   Constitutional: Alert and oriented. Well appearing and in no acute distress.  ____________________________________________   LABS (all labs ordered are listed, but only abnormal results are displayed)  Labs Reviewed - No data to display ____________________________________________  EKG   ____________________________________________  RADIOLOGY  ED MD interpretation:    Official radiology report(s): No results found.  ____________________________________________   PROCEDURES  Procedure(s) performed: None  Procedures  Critical Care performed: No  ____________________________________________   INITIAL IMPRESSION / ASSESSMENT AND PLAN / ED COURSE  As part of my medical decision making, I reviewed the following data within the electronic MEDICAL RECORD NUMBER    Patient requesting work note to excuse her for today and tomorrow.  Explained to patient via physical examination I cannot determine if she is able to return back to work tomorrow.  I told the patient her discharge is documentation she was here today and she may presented to her employer.  Advised the patient to follow-up with the [facility] or other family clinic for further evaluation  and treatment.  Patient is in no acute distress but again refused any exam.      ____________________________________________   FINAL CLINICAL IMPRESSION(S) / ED DIAGNOSES  Final diagnoses:  Fatigue, unspecified type     ED Discharge Orders    None       Note:  This document was prepared using Dragon voice recognition software and may include unintentional dictation errors.    Joni ReiningSmith, Ronald K, PA-C 05/11/18 1740      Phineas SemenGoodman, Graydon, MD 05/11/18 2039

## 2018-05-11 NOTE — ED Triage Notes (Signed)
Pt states that she has been getting "very dizzy and sick while I'm at work lately", pt states symptoms worse while at work. Pt states she has been feeling dizzy and light-headed. Pt states she does not want evaluation today, pt states "I would rather do the testing through a regular doctor but like I don't have one, but like can I just get a doctor's note". This RN clarified that patient is refusing testing and just wants a doctor's note excusing her from work.

## 2018-05-11 NOTE — ED Notes (Signed)
This RN once again reiterated with patient that all she wanted was a work note and did not want any blood work or evaluation. Pt stated yes, asked about getting a work note for today and tomorrow. Pattricia BossAnnie, RN in room to witness.

## 2018-11-13 DEATH — deceased
